# Patient Record
Sex: Male | Born: 1950 | Race: White | Hispanic: No | Smoking: Former smoker
Health system: Southern US, Community
[De-identification: ages and names within clinical notes are randomized; demographics above are authoritative.]

## PROBLEM LIST (undated history)

## (undated) DIAGNOSIS — E119 Type 2 diabetes mellitus without complications: Secondary | ICD-10-CM

## (undated) HISTORY — PX: CARDIAC SURGERY: SHX584

## (undated) HISTORY — PX: APPENDECTOMY: SHX54

## (undated) HISTORY — PX: HAND SURGERY: SHX662

## (undated) HISTORY — PX: CHOLECYSTECTOMY: SHX55

## (undated) HISTORY — PX: HERNIA REPAIR: SHX51

---

## 2018-08-23 ENCOUNTER — Encounter (HOSPITAL_COMMUNITY): Payer: Self-pay

## 2018-08-23 ENCOUNTER — Other Ambulatory Visit: Payer: Self-pay

## 2018-08-23 ENCOUNTER — Emergency Department (HOSPITAL_COMMUNITY): Payer: Medicare Other

## 2018-08-23 ENCOUNTER — Emergency Department (HOSPITAL_COMMUNITY)
Admission: EM | Admit: 2018-08-23 | Discharge: 2018-08-23 | Disposition: A | Discharge status: Home / Self Care | Payer: Medicare Other | Attending: Emergency Medicine | Admitting: Emergency Medicine

## 2018-08-23 DIAGNOSIS — E119 Type 2 diabetes mellitus without complications: Secondary | ICD-10-CM | POA: Diagnosis not present

## 2018-08-23 DIAGNOSIS — Z87891 Personal history of nicotine dependence: Secondary | ICD-10-CM | POA: Insufficient documentation

## 2018-08-23 DIAGNOSIS — R079 Chest pain, unspecified: Secondary | ICD-10-CM

## 2018-08-23 DIAGNOSIS — R0789 Other chest pain: Secondary | ICD-10-CM | POA: Insufficient documentation

## 2018-08-23 HISTORY — DX: Type 2 diabetes mellitus without complications: E11.9

## 2018-08-23 LAB — CBC
HCT: 33.8 % — ABNORMAL LOW (ref 39.0–52.0)
Hemoglobin: 11.8 g/dL — ABNORMAL LOW (ref 13.0–17.0)
MCH: 30.5 pg (ref 26.0–34.0)
MCHC: 34.9 g/dL (ref 30.0–36.0)
MCV: 87.3 fL (ref 80.0–100.0)
Platelets: 302 10*3/uL (ref 150–400)
RBC: 3.87 MIL/uL — ABNORMAL LOW (ref 4.22–5.81)
RDW: 12.8 % (ref 11.5–15.5)
WBC: 6.1 10*3/uL (ref 4.0–10.5)
nRBC: 0 % (ref 0.0–0.2)

## 2018-08-23 LAB — TROPONIN I (HIGH SENSITIVITY)
Troponin I (High Sensitivity): 5 ng/L (ref <18)
Troponin I (High Sensitivity): 5 ng/L (ref <18)

## 2018-08-23 LAB — BASIC METABOLIC PANEL
Anion gap: 15 (ref 5–15)
BUN: 19 mg/dL (ref 8–23)
CO2: 18 mmol/L — ABNORMAL LOW (ref 22–32)
Calcium: 9.8 mg/dL (ref 8.9–10.3)
Chloride: 98 mmol/L (ref 98–111)
Creatinine, Ser: 1.47 mg/dL — ABNORMAL HIGH (ref 0.61–1.24)
GFR calc Af Amer: 56 mL/min — ABNORMAL LOW (ref >60)
GFR calc non Af Amer: 48 mL/min — ABNORMAL LOW (ref >60)
Glucose, Bld: 242 mg/dL — ABNORMAL HIGH (ref 70–99)
Potassium: 4.5 mmol/L (ref 3.5–5.1)
Sodium: 131 mmol/L — ABNORMAL LOW (ref 135–145)

## 2018-08-23 LAB — D-DIMER, QUANTITATIVE: D-Dimer, Quant: 0.33 ug/mL-FEU (ref 0.00–0.50)

## 2018-08-23 MED ORDER — SODIUM CHLORIDE 0.9% FLUSH: 3.0000 mL | Freq: Once | INTRAVENOUS | Status: DC

## 2018-08-23 NOTE — ED Provider Notes (Signed)
[facility] DEPT Provider Note   CSN: [id number] Arrival date & time: 08/23/18  1007    History   Chief Complaint Chief Complaint  Patient presents with  . Chest Pain    HPI Larry Fife is a 68 y.o. male.     HPI Patient presents with chest pain.  It was sharp and burning in his mid to left chest.  No radiation to the arm or back.  Began around 930 this morning.  Last around half hour.  Does not feel like previous GERD.  Former smoker.  History of hypertension and high cholesterol.  Does not have family history of early cardiac disease.  Pain improved now.  Was reportedly nauseous and sweaty.  No fevers.  No cough.  Has not had pains like this before.  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  States he did not involve physical activity.  Has had no problem with physical activity recently.  States he has had heart catheterizations previously.  States he never had a stent.  Last heart cath was around 10 years ago.  Pain is improved now. Past Medical History:  Diagnosis Date  . Diabetes mellitus without complication ([facility])     There are no active problems to display for this patient.   Past Surgical History:  Procedure Laterality Date  . APPENDECTOMY    . CARDIAC SURGERY    . CHOLECYSTECTOMY    . HAND SURGERY Right   . HERNIA REPAIR          Home Medications    Prior to Admission medications   Not on File    Family History No family history on file.  Social History Social History   Tobacco Use  . Smoking status: Former Research scientist (life sciences)  . Smokeless tobacco: Current User    Types: Chew  Substance Use Topics  . Alcohol use: Never    Frequency: Never  . Drug use: Never     Allergies   Penicillins and Sulfa antibiotics   Review of Systems Review of Systems  Constitutional: Positive for diaphoresis. Negative for appetite change.  Respiratory: Positive for shortness of breath.   Cardiovascular:  Positive for chest pain. Negative for leg swelling.  Gastrointestinal: Negative for abdominal pain.  Genitourinary: Negative for flank pain.  Musculoskeletal: Negative for back pain.  Skin: Negative for rash.  Neurological: Negative for weakness and light-headedness.  Psychiatric/Behavioral: Negative for confusion.     Physical Exam Updated Vital Signs BP 126/80   Pulse 90   Temp 98.3 F (36.8 C) (Oral)   Resp 19   Ht 5\' 11"  (1.803 m)   Wt 90.7 kg   SpO2 97%   BMI 27.89 kg/m   Physical Exam Vitals signs and nursing note reviewed.  Constitutional:      Appearance: He is not diaphoretic.  HENT:     Head: Normocephalic.  Neck:     Musculoskeletal: Neck supple.  Cardiovascular:     Rate and Rhythm: Regular rhythm.  Pulmonary:     Breath sounds: No wheezing or rhonchi.  Chest:     Chest wall: No tenderness.  Musculoskeletal:     Right lower leg: He exhibits no tenderness. No edema.     Left lower leg: He exhibits no tenderness. No edema.  Skin:    General: Skin is warm.     Capillary Refill: Capillary refill takes less than 2 seconds.  Neurological:     Mental Status:  He is alert and oriented to person, place, and time.      ED Treatments / Results  Labs (all labs ordered are listed, but only abnormal results are displayed) Labs Reviewed  BASIC METABOLIC PANEL - Abnormal; Notable for the following components:      Result Value   Sodium 131 (*)    CO2 18 (*)    Glucose, Bld 242 (*)    Creatinine, Ser 1.47 (*)    GFR calc non Af Amer 48 (*)    GFR calc Af Amer 56 (*)    All other components within normal limits  CBC - Abnormal; Notable for the following components:   RBC 3.87 (*)    Hemoglobin 11.8 (*)    HCT 33.8 (*)    All other components within normal limits  D-DIMER, QUANTITATIVE (NOT AT [facility])  TROPONIN I (HIGH SENSITIVITY)  TROPONIN I (HIGH SENSITIVITY)    EKG EKG Interpretation  Date/Time:  Monday August 23 2018 10:19:19 EDT Ventricular  Rate:  91 PR Interval:    QRS Duration: 95 QT Interval:  288 QTC Calculation: 355 R Axis:   65 Text Interpretation:  Sinus rhythm Supraventricular bigeminy Borderline low voltage, extremity leads Nonspecific T abnormalities, lateral leads Confirmed by Geoffery LyonseLo, Douglas ([id number]) on 08/23/2018 10:22:33 AM Also confirmed by Benjiman CorePickering, Kekoa Fyock [phone])  on 08/23/2018 12:19:13 PM Also confirmed by Benjiman CorePickering, Alezander Dimaano [phone]), editor Elita QuickWatlington, Beverly [phone])  on 08/23/2018 1:48:58 PM   Radiology Dg Chest 2 View  Result Date: 08/23/2018 CLINICAL DATA:  Chest pain EXAM: CHEST - 2 VIEW COMPARISON:  None. FINDINGS: The heart size and mediastinal contours are within normal limits. Both lungs are clear. The visualized skeletal structures are unremarkable. IMPRESSION: No active cardiopulmonary disease. Electronically Signed   By: Duanne GuessNicholas  Plundo M.D.   On: 08/23/2018 11:26    Procedures Procedures (including critical care time)  Medications Ordered in ED Medications  sodium chloride flush (NS) 0.9 % injection 3 mL (has no administration in time range)     Initial Impression / Assessment and Plan / ED Course  I have reviewed the triage vital signs and the nursing notes.  Pertinent labs & imaging results that were available during my care of the patient were reviewed by me and considered in my medical decision making (see chart for details).     Patient with chest pain.  Lasted about half hour.  Started at 930.  EKG reassuring.  Enzymes negative.  Troponin negative x2.  Pain resolved.  Not exertional.  Reported negative cath although that was around 10 years ago.  Heart score of 6.  I think reasonable for outpatient follow-up.  Discharge home.  Will return if pain returns or becomes exertional.  Final Clinical Impressions(s) / ED Diagnoses   Final diagnoses:  Nonspecific chest pain    ED Discharge Orders    None       Benjiman CorePickering, Sarina Robleto, MD 08/23/18 [phone]

## 2018-08-23 NOTE — ED Triage Notes (Signed)
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 Pt states he woke up fine, but approximately 45 minutes ago (0930), he started having left chest pain that radiates down to his epigastric area. Pt states he was sweating in his truck on the way over.

## 2018-08-23 NOTE — Discharge Instructions (Addendum)
Follow-up with your doctor for further evaluation of your chest pain.  Return if the pain returns or if you begin to have pain with exertion.

## 2020-08-20 ENCOUNTER — Emergency Department (HOSPITAL_BASED_OUTPATIENT_CLINIC_OR_DEPARTMENT_OTHER)
Admission: EM | Admit: 2020-08-20 | Discharge: 2020-08-20 | Disposition: A | Discharge status: Home / Self Care | Payer: Medicare Other | Attending: Emergency Medicine | Admitting: Emergency Medicine

## 2020-08-20 ENCOUNTER — Emergency Department (HOSPITAL_BASED_OUTPATIENT_CLINIC_OR_DEPARTMENT_OTHER): Payer: Medicare Other

## 2020-08-20 ENCOUNTER — Encounter (HOSPITAL_BASED_OUTPATIENT_CLINIC_OR_DEPARTMENT_OTHER): Payer: Self-pay

## 2020-08-20 ENCOUNTER — Other Ambulatory Visit: Payer: Self-pay

## 2020-08-20 DIAGNOSIS — Y92410 Unspecified street and highway as the place of occurrence of the external cause: Secondary | ICD-10-CM | POA: Insufficient documentation

## 2020-08-20 DIAGNOSIS — Z87891 Personal history of nicotine dependence: Secondary | ICD-10-CM | POA: Insufficient documentation

## 2020-08-20 DIAGNOSIS — E119 Type 2 diabetes mellitus without complications: Secondary | ICD-10-CM | POA: Diagnosis not present

## 2020-08-20 DIAGNOSIS — R072 Precordial pain: Secondary | ICD-10-CM | POA: Insufficient documentation

## 2020-08-20 DIAGNOSIS — M549 Dorsalgia, unspecified: Secondary | ICD-10-CM | POA: Diagnosis not present

## 2020-08-20 MED ORDER — ONDANSETRON 4 MG PO TBDP
4.0000 mg | ORAL_TABLET | Freq: Once | ORAL | Status: AC
  Administered 2020-08-20: 4 mg via ORAL
  Filled 2020-08-20: qty 1

## 2020-08-20 MED ORDER — OXYCODONE-ACETAMINOPHEN 5-325 MG PO TABS
1.0000 | ORAL_TABLET | Freq: Once | ORAL | Status: AC
  Administered 2020-08-20: 1 via ORAL
  Filled 2020-08-20: qty 1

## 2020-08-20 MED ORDER — LIDOCAINE 5 % EX PTCH: 3.0000 | MEDICATED_PATCH | CUTANEOUS | 0 refills | Status: AC
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
## 2020-08-20 NOTE — Discharge Instructions (Addendum)
Your x-ray showed no fractures in your back. You may have minor displaced left rib fractures, however there is no need for any operation.  Apply up to 3 lidocaine patches to your back at a time. It is important that you leave the patches off for 12 hours each day.  You may use the patches for 12 hours during the night or during the day, it is up to you.
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
